# Patient Record
Sex: Male | Born: 1955 | Race: White | Hispanic: No | Marital: Single | Smoking: Current every day smoker
Health system: Southern US, Community
[De-identification: ages and names within clinical notes are randomized; demographics above are authoritative.]

## PROBLEM LIST (undated history)

## (undated) DIAGNOSIS — B1920 Unspecified viral hepatitis C without hepatic coma: Secondary | ICD-10-CM

## (undated) DIAGNOSIS — C4490 Unspecified malignant neoplasm of skin, unspecified: Secondary | ICD-10-CM

## (undated) DIAGNOSIS — K7460 Unspecified cirrhosis of liver: Secondary | ICD-10-CM

## (undated) DIAGNOSIS — Z8619 Personal history of other infectious and parasitic diseases: Secondary | ICD-10-CM

## (undated) DIAGNOSIS — M1990 Unspecified osteoarthritis, unspecified site: Secondary | ICD-10-CM

## (undated) HISTORY — PX: FRACTURE SURGERY: SHX138

## (undated) HISTORY — PX: APPENDECTOMY: SHX54

---

## 2014-08-22 ENCOUNTER — Other Ambulatory Visit: Payer: Self-pay | Admitting: Gastroenterology

## 2014-08-22 DIAGNOSIS — R748 Abnormal levels of other serum enzymes: Secondary | ICD-10-CM

## 2014-08-23 ENCOUNTER — Other Ambulatory Visit (HOSPITAL_COMMUNITY): Payer: Self-pay | Admitting: Gastroenterology

## 2014-08-23 ENCOUNTER — Other Ambulatory Visit: Payer: Self-pay | Admitting: Gastroenterology

## 2014-08-23 DIAGNOSIS — R748 Abnormal levels of other serum enzymes: Secondary | ICD-10-CM

## 2014-08-26 ENCOUNTER — Ambulatory Visit
Admission: RE | Admit: 2014-08-26 | Discharge: 2014-08-26 | Disposition: A | Discharge status: Home / Self Care | Payer: BC Managed Care – PPO | Source: Ambulatory Visit | Attending: Gastroenterology | Admitting: Gastroenterology

## 2014-08-26 ENCOUNTER — Ambulatory Visit: Admission: RE | Admit: 2014-08-26 | Payer: BC Managed Care – PPO | Source: Ambulatory Visit

## 2014-08-26 DIAGNOSIS — K7460 Unspecified cirrhosis of liver: Secondary | ICD-10-CM | POA: Insufficient documentation

## 2014-08-26 DIAGNOSIS — K766 Portal hypertension: Secondary | ICD-10-CM | POA: Insufficient documentation

## 2014-08-26 DIAGNOSIS — R748 Abnormal levels of other serum enzymes: Secondary | ICD-10-CM | POA: Insufficient documentation

## 2014-08-30 ENCOUNTER — Ambulatory Visit: Payer: Self-pay

## 2014-09-19 ENCOUNTER — Encounter: Payer: Self-pay | Admitting: *Deleted

## 2014-09-20 ENCOUNTER — Ambulatory Visit: Payer: BC Managed Care – PPO | Admitting: Anesthesiology

## 2014-09-20 ENCOUNTER — Encounter
Admission: RE | Disposition: A | Discharge status: Home / Self Care | Payer: Self-pay | Source: Ambulatory Visit | Attending: Gastroenterology

## 2014-09-20 ENCOUNTER — Encounter: Payer: Self-pay | Admitting: *Deleted

## 2014-09-20 ENCOUNTER — Ambulatory Visit
Admission: RE | Admit: 2014-09-20 | Discharge: 2014-09-20 | Disposition: A | Discharge status: Home / Self Care | Payer: BC Managed Care – PPO | Source: Ambulatory Visit | Attending: Gastroenterology | Admitting: Gastroenterology

## 2014-09-20 DIAGNOSIS — K766 Portal hypertension: Secondary | ICD-10-CM | POA: Insufficient documentation

## 2014-09-20 DIAGNOSIS — B1920 Unspecified viral hepatitis C without hepatic coma: Secondary | ICD-10-CM | POA: Diagnosis not present

## 2014-09-20 DIAGNOSIS — I8510 Secondary esophageal varices without bleeding: Secondary | ICD-10-CM | POA: Diagnosis not present

## 2014-09-20 DIAGNOSIS — Z85828 Personal history of other malignant neoplasm of skin: Secondary | ICD-10-CM | POA: Insufficient documentation

## 2014-09-20 DIAGNOSIS — Z79899 Other long term (current) drug therapy: Secondary | ICD-10-CM | POA: Diagnosis not present

## 2014-09-20 DIAGNOSIS — F17210 Nicotine dependence, cigarettes, uncomplicated: Secondary | ICD-10-CM | POA: Insufficient documentation

## 2014-09-20 DIAGNOSIS — Z1389 Encounter for screening for other disorder: Secondary | ICD-10-CM | POA: Diagnosis present

## 2014-09-20 DIAGNOSIS — K3189 Other diseases of stomach and duodenum: Secondary | ICD-10-CM | POA: Insufficient documentation

## 2014-09-20 DIAGNOSIS — K7460 Unspecified cirrhosis of liver: Secondary | ICD-10-CM | POA: Insufficient documentation

## 2014-09-20 DIAGNOSIS — M1990 Unspecified osteoarthritis, unspecified site: Secondary | ICD-10-CM | POA: Insufficient documentation

## 2014-09-20 HISTORY — PX: ESOPHAGOGASTRODUODENOSCOPY: SHX5428

## 2014-09-20 HISTORY — DX: Personal history of other infectious and parasitic diseases: Z86.19

## 2014-09-20 HISTORY — DX: Unspecified viral hepatitis C without hepatic coma: B19.20

## 2014-09-20 HISTORY — DX: Unspecified malignant neoplasm of skin, unspecified: C44.90

## 2014-09-20 HISTORY — DX: Unspecified osteoarthritis, unspecified site: M19.90

## 2014-09-20 HISTORY — DX: Unspecified cirrhosis of liver: K74.60

## 2014-09-20 SURGERY — ESOPHAGOGASTRODUODENOSCOPY (EGD) WITH PROPOFOL
Anesthesia: General

## 2014-09-20 SURGERY — EGD (ESOPHAGOGASTRODUODENOSCOPY)
Anesthesia: General

## 2014-09-20 MED ORDER — SODIUM CHLORIDE 0.9 % IV SOLN: INTRAVENOUS | Status: DC

## 2014-09-20 MED ORDER — EPHEDRINE SULFATE 50 MG/ML IJ SOLN
INTRAMUSCULAR | Status: DC | PRN
  Administered 2014-09-20: 20 mg via INTRAVENOUS

## 2014-09-20 MED ORDER — SODIUM CHLORIDE 0.9 % IV SOLN
INTRAVENOUS | Status: DC
  Administered 2014-09-20 (×2): via INTRAVENOUS

## 2014-09-20 MED ORDER — ONDANSETRON HCL 4 MG/2ML IJ SOLN: 4.0000 mg | Freq: Once | INTRAMUSCULAR | Status: DC | PRN

## 2014-09-20 MED ORDER — MIDAZOLAM HCL 5 MG/5ML IJ SOLN
INTRAMUSCULAR | Status: DC | PRN
  Administered 2014-09-20: 1 mg via INTRAVENOUS

## 2014-09-20 MED ORDER — PROPOFOL 10 MG/ML IV BOLUS
INTRAVENOUS | Status: DC | PRN
Start: 2014-09-20 — End: 2014-09-20
  Administered 2014-09-20: 45 mg via INTRAVENOUS

## 2014-09-20 MED ORDER — FENTANYL CITRATE (PF) 100 MCG/2ML IJ SOLN
INTRAMUSCULAR | Status: DC | PRN
  Administered 2014-09-20: 50 ug via INTRAVENOUS

## 2014-09-20 MED ORDER — FENTANYL CITRATE (PF) 100 MCG/2ML IJ SOLN: 25.0000 ug | INTRAMUSCULAR | Status: DC | PRN

## 2014-09-20 NOTE — Transfer of Care (Signed)
Immediate Anesthesia Transfer of Care Note  Patient: Lawrence Reilly  Procedure(s) Performed: Procedure(s): ESOPHAGOGASTRODUODENOSCOPY (EGD) (N/A)  Patient Location: PACU  Anesthesia Type:General  Level of Consciousness: awake  Airway & Oxygen Therapy: Patient Spontanous Breathing and Patient connected to nasal cannula oxygen  Post-op Assessment: Report given to RN and Post -op Vital signs reviewed and stable  Post vital signs: Reviewed and stable  Last Vitals:  Filed Vitals:   09/20/14 0737  BP: 126/66  Pulse: 89  Temp: 35.9 C  Resp: 17    Complications: No apparent anesthesia complications

## 2014-09-20 NOTE — H&P (Signed)
  Primary Care Physician:  Kirk Ruths., MD  Pre-Procedure History & Physical: HPI:  Lawrence Reilly is a 59 y.o. male is here for an endoscopy.   Past Medical History  Diagnosis Date  . Hepatitis C   . Arthritis   . History of chickenpox   . Skin cancer   . Cirrhosis of liver     Past Surgical History  Procedure Laterality Date  . Appendectomy    . Fracture surgery      Prior to Admission medications   Medication Sig Start Date End Date Taking? Authorizing Provider  ondansetron (ZOFRAN-ODT) 4 MG disintegrating tablet Take 4 mg by mouth every 8 (eight) hours as needed for nausea or vomiting.   Yes Historical Provider, MD  promethazine (PHENERGAN) 12.5 MG suppository Place 12.5 mg rectally every 6 (six) hours as needed for nausea or vomiting.   Yes Historical Provider, MD    Allergies as of 09/17/2014  . (Not on File)    History reviewed. No pertinent family history.  History   Social History  . Marital Status: Divorced    Spouse Name: N/A  . Number of Children: N/A  . Years of Education: N/A   Occupational History  . Not on file.   Social History Main Topics  . Smoking status: Current Every Day Smoker  . Smokeless tobacco: Not on file  . Alcohol Use: 0.6 oz/week    1 Cans of beer per week  . Drug Use: Not on file  . Sexual Activity: Not on file   Other Topics Concern  . Not on file   Social History Narrative     Physical Exam: BP 126/66 mmHg  Pulse 89  Temp(Src) 96.7 F (35.9 C) (Tympanic)  Resp 17  Ht 5\' 9"  (1.753 m)  Wt 165 lb (74.844 kg)  BMI 24.36 kg/m2  SpO2 98% General:   Alert,  pleasant and cooperative in NAD Head:  Normocephalic and atraumatic. Neck:  Supple; no masses or thyromegaly. Lungs:  Clear throughout to auscultation.    Heart:  Regular rate and rhythm. Abdomen:  Soft, nontender and nondistended. Normal bowel sounds, without guarding, and without rebound.   Neurologic:  Alert and  oriented x4;  grossly normal  neurologically.  Impression/Plan: Lawrence Reilly is here for an endoscopy to be performed for portal HTN, variceal screening  Risks, benefits, limitations, and alternatives regarding  endoscopy have been reviewed with the patient.  Questions have been answered.  All parties agreeable.   Josefine Class, MD  09/20/2014, 8:10 AM

## 2014-09-20 NOTE — Discharge Instructions (Signed)

## 2014-09-20 NOTE — Op Note (Signed)
[facility] Gastroenterology Patient Name: Lawrence Reilly Procedure Date: 09/20/2014 7:50 AM MRN: [id number] Account #: [id number] Date of Birth: 1955/07/25 Admit Type: Outpatient Age: 59 Room: [facility] ENDO ROOM 3 Gender: Male Note Status: Finalized Procedure:         Upper GI endoscopy Indications:       Cirrhosis rule out esophageal varices, Portal hypertension                     rule out esophageal varices Patient Profile:   This is a 59 year old male. Providers:         Gerrit Heck. Rayann Heman, MD Referring MD:      Ocie Cornfield. Ouida Sills, MD (Referring MD) Medicines:         Propofol per Anesthesia Complications:     No immediate complications. Procedure:         Pre-Anesthesia Assessment:                    - Prior to the procedure, a History and Physical was                     performed, and patient medications, allergies and                     sensitivities were reviewed. The patient's tolerance of                     previous anesthesia was reviewed.                    After obtaining informed consent, the endoscope was passed                     under direct vision. Throughout the procedure, the                     patient's blood pressure, pulse, and oxygen saturations                     were monitored continuously. The Endoscope was introduced                     through the mouth, and advanced to the second part of                     duodenum. The upper GI endoscopy was accomplished without                     difficulty. The patient tolerated the procedure well. Findings:      Grade I varices were found in the lower third of the esophagus.      Moderate portal hypertensive gastropathy was found in the entire       examined stomach.      Patchy moderately erythematous mucosa was found in the duodenal bulb. Impression:        - Grade I esophageal varices.                    - Portal hypertensive gastropathy.                    - Erythematous duodenopathy.               - No specimens collected. Recommendation:    - Observe patient in GI recovery unit.                    -  Resume regular diet.                    - Continue present medications.                    - Repeat the upper endoscopy in 2 years for screening                     purposes.                    - Return to GI clinic.                    - Will not start b-blocker because varices are small, have                     not bled and clinical benefit of B-blocker in this setting                     is not established.                    - The findings and recommendations were discussed with the                     patient.                    - The findings and recommendations were discussed with the                     patient's family. Procedure Code(s): --- Professional ---                    [phone], Esophagogastroduodenoscopy, flexible, transoral;                     diagnostic, including collection of specimen(s) by                     brushing or washing, when performed (separate procedure) CPT copyright 2014 American Medical Association. All rights reserved. The codes documented in this report are preliminary and upon coder review may  be revised to meet current compliance requirements. Mellody Life, MD 09/20/2014 8:31:16 AM This report has been signed electronically. Number of Addenda: 0 Note Initiated On: 09/20/2014 7:50 AM      [facility]

## 2014-09-20 NOTE — Anesthesia Postprocedure Evaluation (Signed)
  Anesthesia Post-op Note  Patient: Lawrence Reilly  Procedure(s) Performed: Procedure(s): ESOPHAGOGASTRODUODENOSCOPY (EGD) (N/A)  Anesthesia type:General  Patient location: PACU  Post pain: Pain level controlled  Post assessment: Post-op Vital signs reviewed, Patient's Cardiovascular Status Stable, Respiratory Function Stable, Patent Airway and No signs of Nausea or vomiting  Post vital signs: Reviewed and stable  Last Vitals:  Filed Vitals:   09/20/14 0910  BP: 147/81  Pulse: 103  Temp:   Resp: 26    Level of consciousness: awake, alert  and patient cooperative  Complications: No apparent anesthesia complications

## 2014-09-20 NOTE — Anesthesia Procedure Notes (Signed)
Date/Time: 09/20/2014 8:28 AM Performed by: Allean Found Pre-anesthesia Checklist: Patient identified, Emergency Drugs available, Suction available, Patient being monitored and Timeout performed Patient Re-evaluated:Patient Re-evaluated prior to inductionOxygen Delivery Method: Nasal cannula Placement Confirmation: positive ETCO2 Dental Injury: Teeth and Oropharynx as per pre-operative assessment

## 2014-09-20 NOTE — Anesthesia Preprocedure Evaluation (Addendum)
Anesthesia Evaluation  Patient identified by MRN, date of birth, ID band Patient awake    Reviewed: Allergy & Precautions, NPO status , Patient's Chart, lab work & pertinent test results  Airway Mallampati: II  TM Distance: >3 FB Neck ROM: Full    Dental  (+) Chipped   Pulmonary Current Smoker,  breath sounds clear to auscultation  Pulmonary exam normal       Cardiovascular Normal cardiovascular exam    Neuro/Psych negative neurological ROS  negative psych ROS   GI/Hepatic (+) Cirrhosis -      , Hepatitis -, C  Endo/Other  negative endocrine ROS  Renal/GU negative Renal ROS     Musculoskeletal  (+) Arthritis -, Osteoarthritis,    Abdominal Normal abdominal exam  (+)   Peds  Hematology negative hematology ROS (+)   Anesthesia Other Findings   Reproductive/Obstetrics                            Anesthesia Physical Anesthesia Plan  ASA: III  Anesthesia Plan: General   Post-op Pain Management:    Induction: Intravenous  Airway Management Planned: Nasal Cannula  Additional Equipment:   Intra-op Plan:   Post-operative Plan:   Informed Consent: I have reviewed the patients History and Physical, chart, labs and discussed the procedure including the risks, benefits and alternatives for the proposed anesthesia with the patient or authorized representative who has indicated his/her understanding and acceptance.   Dental advisory given  Plan Discussed with: CRNA and Surgeon  Anesthesia Plan Comments:         Anesthesia Quick Evaluation

## 2014-09-21 NOTE — Progress Notes (Signed)
Non-Identifying Voicemail.  No Message left. 

## 2014-09-24 ENCOUNTER — Encounter: Payer: Self-pay | Admitting: Gastroenterology

## 2015-05-05 ENCOUNTER — Other Ambulatory Visit: Payer: Self-pay | Admitting: Student

## 2015-05-05 DIAGNOSIS — K7460 Unspecified cirrhosis of liver: Secondary | ICD-10-CM

## 2015-05-05 DIAGNOSIS — B182 Chronic viral hepatitis C: Secondary | ICD-10-CM

## 2015-05-08 ENCOUNTER — Ambulatory Visit
Admission: RE | Admit: 2015-05-08 | Discharge: 2015-05-08 | Disposition: A | Discharge status: Home / Self Care | Payer: BC Managed Care – PPO | Source: Ambulatory Visit | Attending: Student | Admitting: Student

## 2015-05-08 DIAGNOSIS — K7460 Unspecified cirrhosis of liver: Secondary | ICD-10-CM | POA: Diagnosis present

## 2015-05-08 DIAGNOSIS — B182 Chronic viral hepatitis C: Secondary | ICD-10-CM | POA: Diagnosis present

## 2015-10-21 ENCOUNTER — Other Ambulatory Visit: Payer: Self-pay | Admitting: Student

## 2015-10-21 DIAGNOSIS — B182 Chronic viral hepatitis C: Secondary | ICD-10-CM

## 2015-10-27 ENCOUNTER — Ambulatory Visit
Admission: RE | Admit: 2015-10-27 | Discharge: 2015-10-27 | Disposition: A | Discharge status: Home / Self Care | Payer: BC Managed Care – PPO | Source: Ambulatory Visit | Attending: Student | Admitting: Student

## 2015-10-27 DIAGNOSIS — B182 Chronic viral hepatitis C: Secondary | ICD-10-CM | POA: Diagnosis present

## 2016-01-22 IMAGING — US US ABDOMEN LIMITED
1 series · 13 of 13 positions shown · non-contrast
Comparison: None.

CLINICAL DATA: Elevated liver enzymes

EXAM:
US ABDOMEN LIMITED - RIGHT UPPER QUADRANT
ULTRASOUND HEPATIC ELASTOGRAPHY
TECHNIQUE: Limited right upper quadrant abdominal ultrasound was performed. In
addition, ultrasound elastography evaluation of the liver was
performed. A region of interest was placed in the right lobe of the
liver. Following application of a compressive sonographic pulse,
shear waves were detected in the adjacent hepatic tissue and the
shear wave velocity was calculated. Multiple assessments were
performed at the selected site. Median shear wave velocity is
correlated to a Metavir fibrosis score.

[Series 1: us abdomen limited · 0.21mm/px · 13 of 13 slices shown]
[im 1/13]
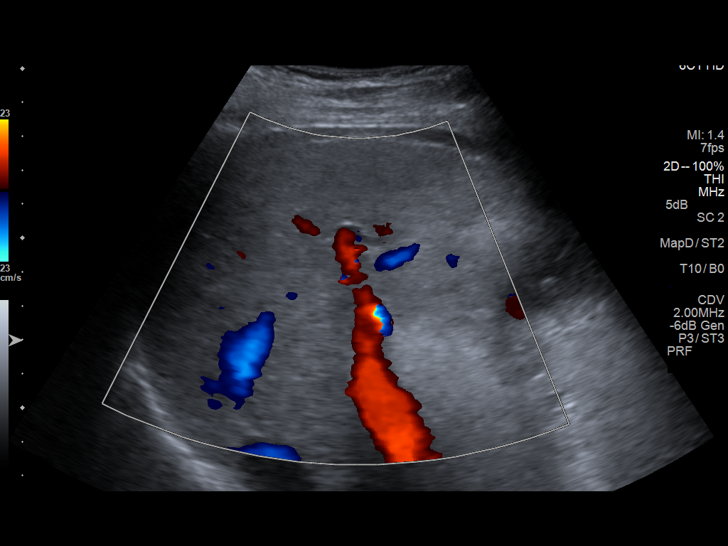
[im 2/13]
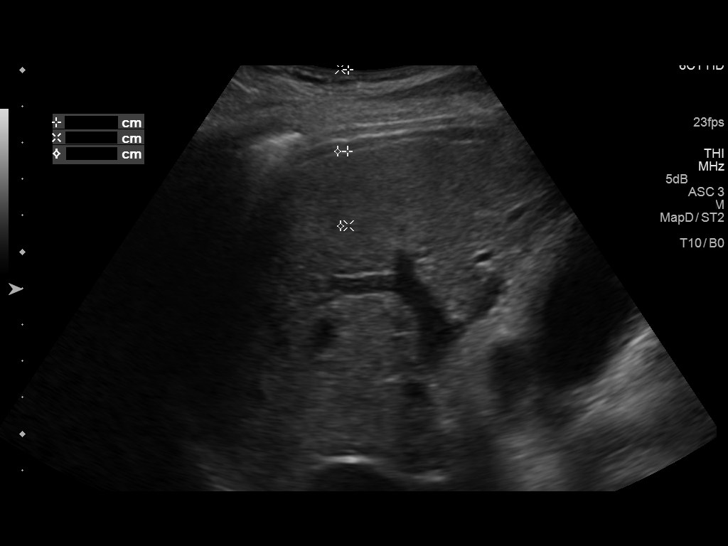
[im 3/13]
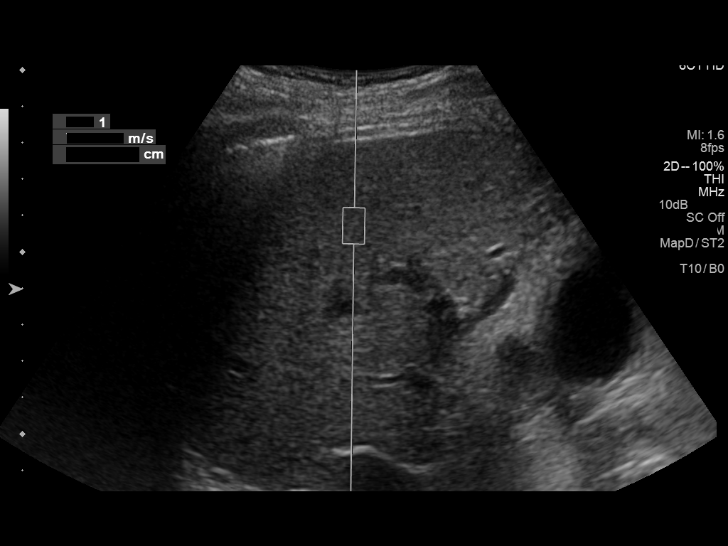
[im 4/13]
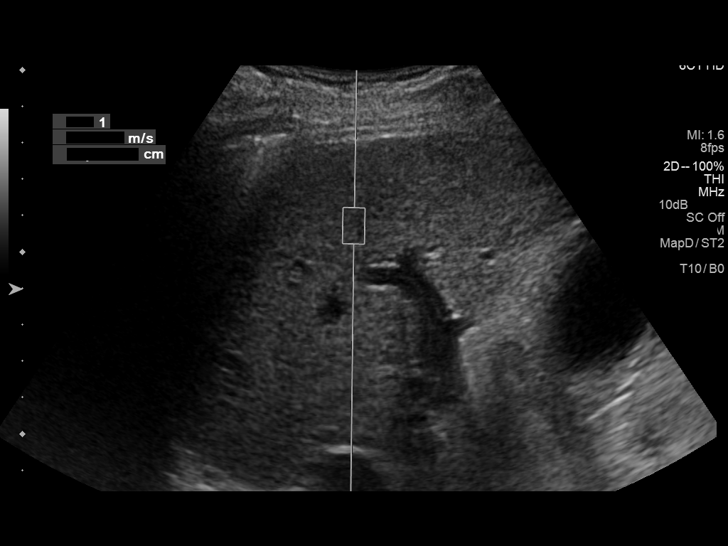
[im 5/13]
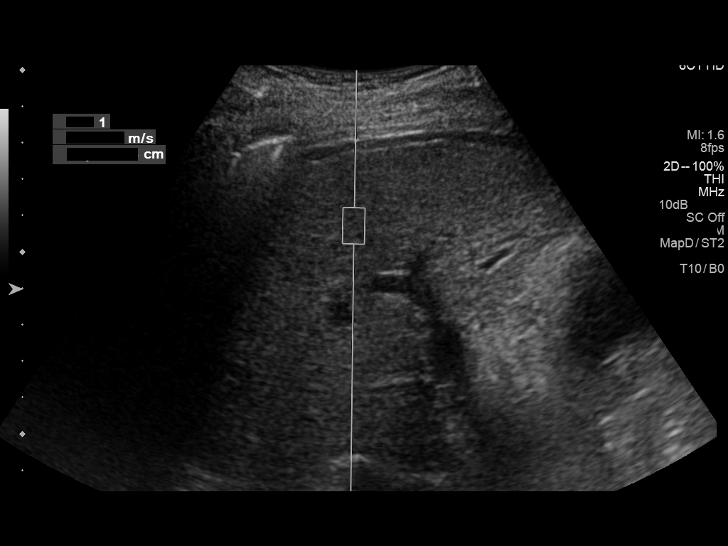
[im 6/13]
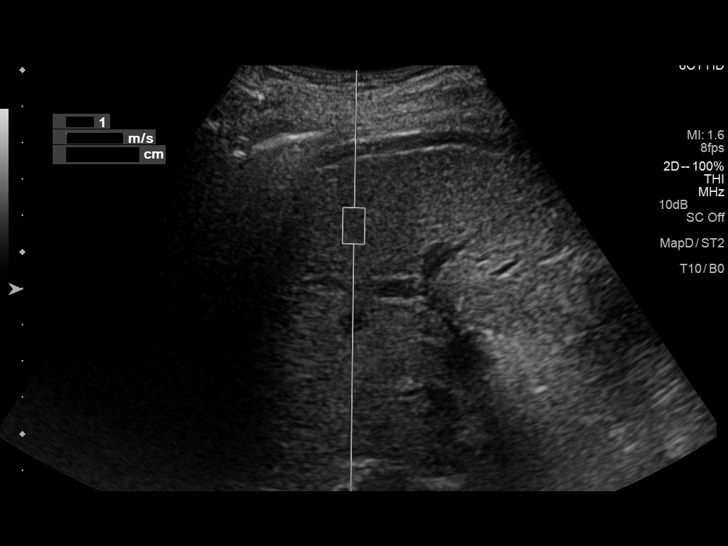
[im 7/13]
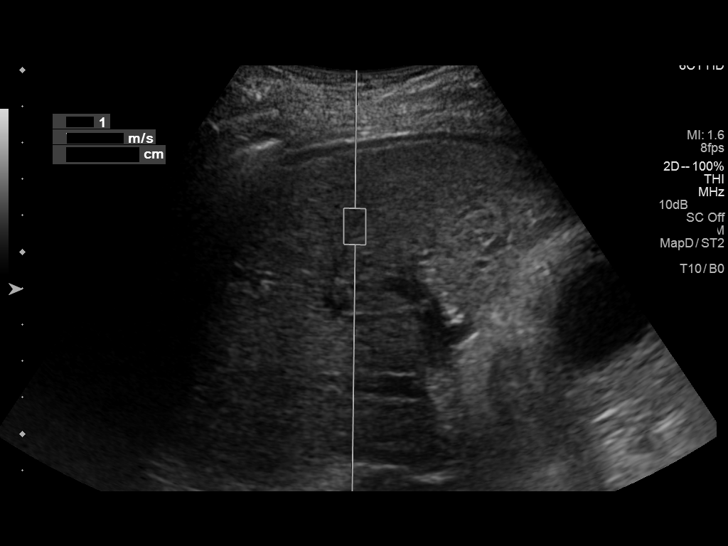
[im 8/13]
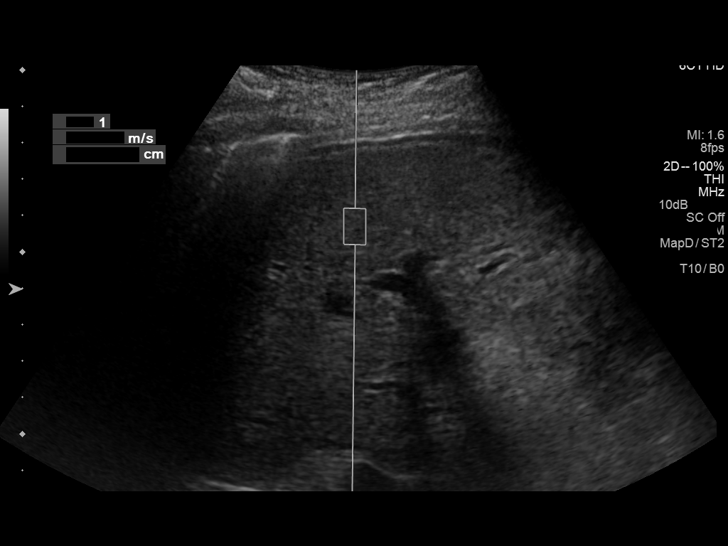
[im 9/13]
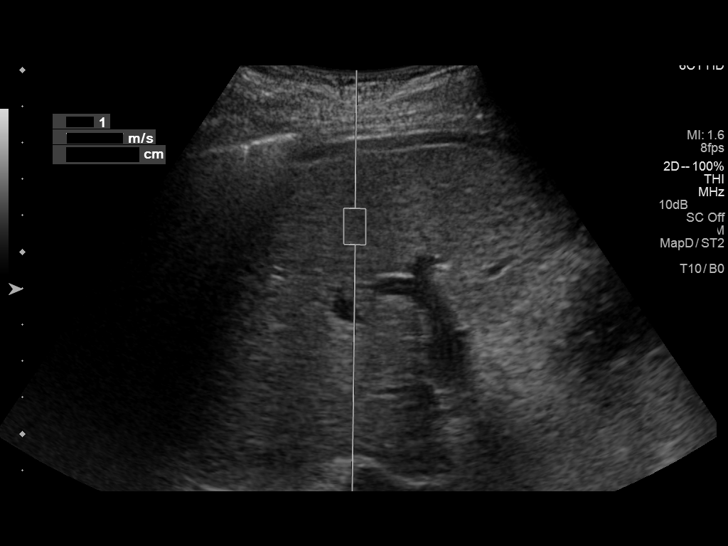
[im 10/13]
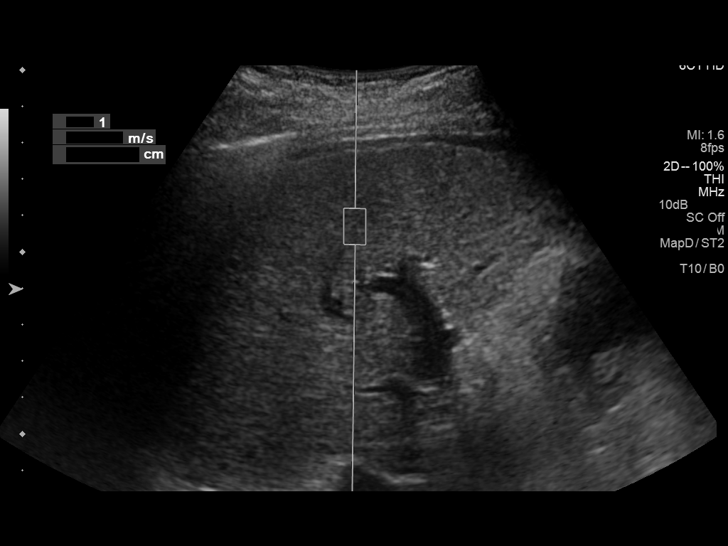
[im 11/13]
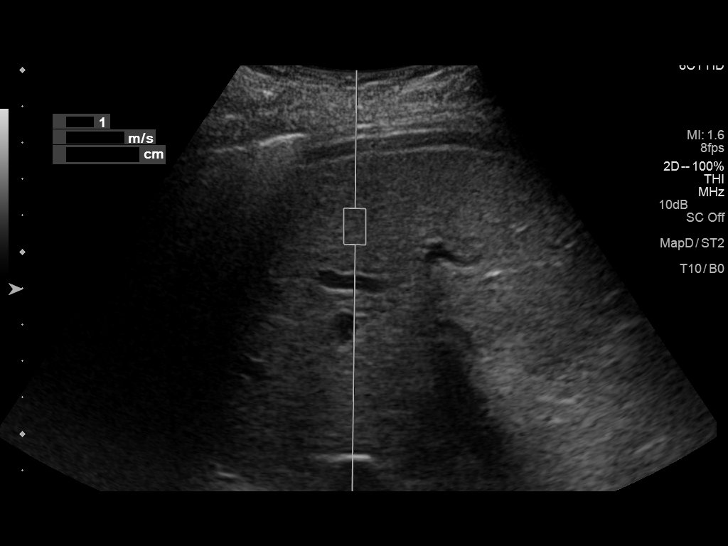
[im 12/13]
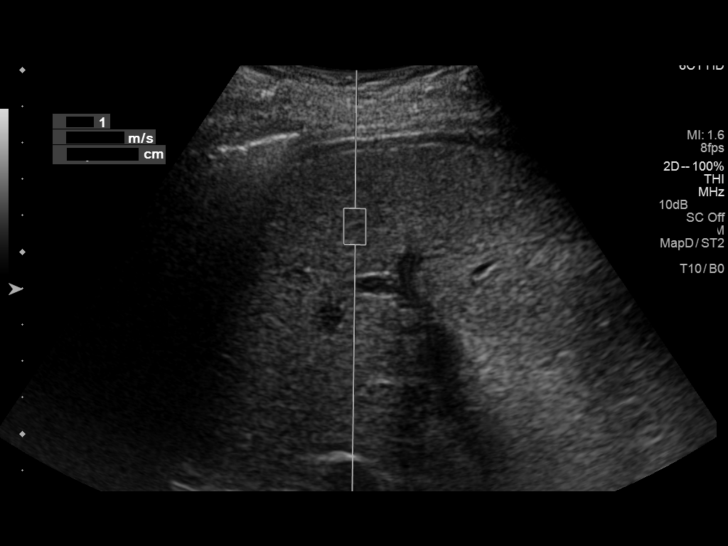
[im 13/13]
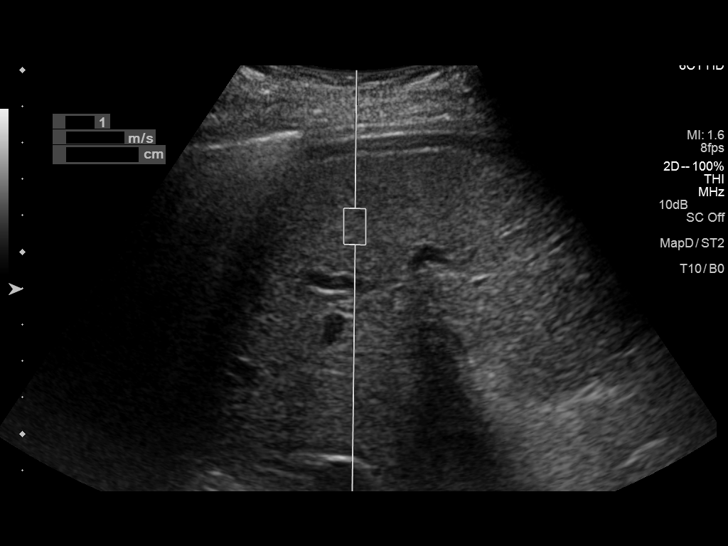

[13 of 13 positions shown; findings below may reference images not displayed]

FINDINGS: ULTRASOUND ABDOMEN LIMITED RIGHT UPPER QUADRANT

Gallbladder: The gallbladder wall appears thickened measuring
mm. Pericholecystic fluid noted. Nonmobile echogenic foci are
identified along the gallbladder wall.

Common bile duct: Diameter: 3.9 mm.

Liver: The liver has a nodular contour or compatible with cirrhosis.
There is mild perihepatic ascites noted.

ULTRASOUND HEPATIC ELASTOGRAPHY

Device: Siemens Helix VQ

Transducer: 6 C1

Patient position: Supine

Number of measurements:  10

Hepatic Segment:  8

Median velocity:   2.4  m/sec

IQR:

IQR/Median velocity ratio:

Corresponding Metavir fibrosis score:  F 3/F 4

Risk of fibrosis: High

Limitations of exam: None

Pertinent findings noted on other imaging exams: Cirrhotic appearing
liver.

Please note that abnormal shear wave velocities may also be
identified in clinical settings other than with hepatic fibrosis,
such as: acute hepatitis, elevated right heart and central venous
pressures including use of beta blockers, Kiss[place] disease
(Cartel), infiltrative processes such as
mastocytosis/amyloidosis/infiltrative tumor, extrahepatic
cholestasis, in the post-prandial state, and liver transplantation.
Correlation with patient history, laboratory data, and clinical
condition recommended.
IMPRESSION: 1. Nodular appearing liver surface compatible with cirrhosis. There
is mild peripancreatic piece and pericholecystic fluid.
2. Gallbladder wall thickening which is nonspecific in the setting
of cirrhosis and portal venous hypertension.

Median hepatic shear wave velocity is calculated at 2.4 m/sec.
Corresponding Metavir fibrosis score is F 3/F 4.
Risk of fibrosis is high.

Follow-up:  Advised

## 2016-01-29 ENCOUNTER — Encounter: Payer: Self-pay | Admitting: *Deleted

## 2016-01-30 ENCOUNTER — Ambulatory Visit
Admission: RE | Admit: 2016-01-30 | Discharge: 2016-01-30 | Disposition: A | Discharge status: Home / Self Care | Payer: BC Managed Care – PPO | Source: Ambulatory Visit | Attending: Unknown Physician Specialty | Admitting: Unknown Physician Specialty

## 2016-01-30 ENCOUNTER — Ambulatory Visit: Payer: BC Managed Care – PPO | Admitting: Anesthesiology

## 2016-01-30 ENCOUNTER — Encounter
Admission: RE | Disposition: A | Discharge status: Home / Self Care | Payer: Self-pay | Source: Ambulatory Visit | Attending: Unknown Physician Specialty

## 2016-01-30 DIAGNOSIS — D128 Benign neoplasm of rectum: Secondary | ICD-10-CM | POA: Insufficient documentation

## 2016-01-30 DIAGNOSIS — D125 Benign neoplasm of sigmoid colon: Secondary | ICD-10-CM | POA: Diagnosis not present

## 2016-01-30 DIAGNOSIS — F17200 Nicotine dependence, unspecified, uncomplicated: Secondary | ICD-10-CM | POA: Insufficient documentation

## 2016-01-30 DIAGNOSIS — J449 Chronic obstructive pulmonary disease, unspecified: Secondary | ICD-10-CM | POA: Insufficient documentation

## 2016-01-30 DIAGNOSIS — K633 Ulcer of intestine: Secondary | ICD-10-CM | POA: Diagnosis not present

## 2016-01-30 DIAGNOSIS — R195 Other fecal abnormalities: Secondary | ICD-10-CM | POA: Insufficient documentation

## 2016-01-30 DIAGNOSIS — Z85828 Personal history of other malignant neoplasm of skin: Secondary | ICD-10-CM | POA: Diagnosis not present

## 2016-01-30 HISTORY — PX: COLONOSCOPY WITH PROPOFOL: SHX5780

## 2016-01-30 SURGERY — COLONOSCOPY WITH PROPOFOL
Anesthesia: General

## 2016-01-30 MED ORDER — LIDOCAINE 2% (20 MG/ML) 5 ML SYRINGE
INTRAMUSCULAR | Status: DC | PRN
  Administered 2016-01-30: 40 mg via INTRAVENOUS

## 2016-01-30 MED ORDER — SODIUM CHLORIDE 0.9 % IV SOLN: INTRAVENOUS | Status: DC

## 2016-01-30 MED ORDER — PROPOFOL 10 MG/ML IV BOLUS
INTRAVENOUS | Status: DC | PRN
  Administered 2016-01-30: 100 mg via INTRAVENOUS

## 2016-01-30 MED ORDER — PROPOFOL 500 MG/50ML IV EMUL
INTRAVENOUS | Status: DC | PRN
  Administered 2016-01-30: 180 ug/kg/min via INTRAVENOUS

## 2016-01-30 MED ORDER — EPHEDRINE SULFATE 50 MG/ML IJ SOLN
INTRAMUSCULAR | Status: DC | PRN
  Administered 2016-01-30: 5 mg via INTRAVENOUS

## 2016-01-30 MED ORDER — MIDAZOLAM HCL 5 MG/5ML IJ SOLN
INTRAMUSCULAR | Status: DC | PRN
  Administered 2016-01-30: 1 mg via INTRAVENOUS

## 2016-01-30 MED ORDER — PHENYLEPHRINE HCL 10 MG/ML IJ SOLN
INTRAMUSCULAR | Status: DC | PRN
  Administered 2016-01-30 (×2): 100 ug via INTRAVENOUS

## 2016-01-30 MED ORDER — FENTANYL CITRATE (PF) 100 MCG/2ML IJ SOLN
INTRAMUSCULAR | Status: DC | PRN
  Administered 2016-01-30: 50 ug via INTRAVENOUS

## 2016-01-30 MED ORDER — SODIUM CHLORIDE 0.9 % IV SOLN
INTRAVENOUS | Status: DC
  Administered 2016-01-30: 1000 mL via INTRAVENOUS
  Administered 2016-01-30: 15:00:00 via INTRAVENOUS

## 2016-01-30 NOTE — Transfer of Care (Signed)
Immediate Anesthesia Transfer of Care Note  Patient: Lawrence Reilly  Procedure(s) Performed: Procedure(s): COLONOSCOPY WITH PROPOFOL (N/A)  Patient Location: PACU and Endoscopy Unit  Anesthesia Type:General  Level of Consciousness: awake and patient cooperative  Airway & Oxygen Therapy: Patient Spontanous Breathing and Patient connected to nasal cannula oxygen  Post-op Assessment: Report given to RN and Post -op Vital signs reviewed and stable  Post vital signs: Reviewed and stable  Last Vitals:  Vitals:   01/30/16 1415  BP: 129/68  Resp: 20  Temp: 36.3 C    Last Pain: There were no vitals filed for this visit.       Complications: No apparent anesthesia complications

## 2016-01-30 NOTE — Op Note (Signed)
[facility] Gastroenterology Patient Name: Lawrence Reilly Procedure Date: 01/30/2016 3:23 PM MRN: [id number] Account #: [id number] Date of Birth: 1955-10-04 Admit Type: Outpatient Age: 60 Room: [facility] ENDO ROOM 1 Gender: Male Note Status: Finalized Procedure:            Colonoscopy Indications:          Positive Cologuard test Providers:            Manya Silvas, MD Referring MD:         Ocie Cornfield. Ouida Sills MD, MD (Referring MD) Medicines:            Propofol per Anesthesia Complications:        No immediate complications. Procedure:            Pre-Anesthesia Assessment:                       - After reviewing the risks and benefits, the patient                        was deemed in satisfactory condition to undergo the                        procedure.                       After obtaining informed consent, the colonoscope was                        passed under direct vision. Throughout the procedure,                        the patient's blood pressure, pulse, and oxygen                        saturations were monitored continuously. The                        Colonoscope was introduced through the anus and                        advanced to the the cecum, identified by appendiceal                        orifice and ileocecal valve. The colonoscopy was                        performed without difficulty. The patient tolerated the                        procedure well. The quality of the bowel preparation                        was excellent. Findings:      A small polyp was found in the sigmoid colon. The polyp was sessile. The       polyp was removed with a hot snare. Resection and retrieval were       complete. To prevent bleeding after the polypectomy, one hemostatic clip       was successfully placed. There was no bleeding during, or at the end, of       the procedure.  A diminutive polyp was found in the rectum. The polyp was sessile. The       polyp  was removed with a hot snare. Resection and retrieval were       complete.      A single (solitary) four mm ulcer was found in the proximal ascending       colon. No bleeding was present. No stigmata of recent bleeding were       seen. Biopsies were taken with a cold forceps for histology.      Multiple small- large patchy angioectasias without bleeding were found       in the transverse colon, in the ascending colon and in the cecum. Impression:           - One small polyp in the sigmoid colon, removed with a                        hot snare. Resected and retrieved. Clip was placed.                       - One diminutive polyp in the rectum, removed with a                        hot snare. Resected and retrieved.                       - A single (solitary) ulcer in the proximal ascending                        colon. Biopsied.                       - Multiple non-bleeding colonic angioectasias. Recommendation:       - Await pathology results. Manya Silvas, MD 01/30/2016 3:55:22 PM This report has been signed electronically. Number of Addenda: 0 Note Initiated On: 01/30/2016 3:23 PM Scope Withdrawal Time: 0 hours 14 minutes 12 seconds  Total Procedure Duration: 0 hours 20 minutes 20 seconds       [facility]

## 2016-01-30 NOTE — H&P (Signed)
   Primary Care Physician:  Kirk Ruths., MD Primary Gastroenterologist:  Dr. Vira Agar  Pre-Procedure History & Physical: HPI:  Lawrence Reilly is a 60 y.o. male is here for an colonoscopy.   Past Medical History:  Diagnosis Date  . Arthritis   . Cirrhosis of liver ([place])   . Hepatitis C   . History of chickenpox   . Skin cancer     Past Surgical History:  Procedure Laterality Date  . APPENDECTOMY    . ESOPHAGOGASTRODUODENOSCOPY N/A 09/20/2014   Procedure: ESOPHAGOGASTRODUODENOSCOPY (EGD);  Surgeon: Josefine Class, MD;  Location: [facility] ENDOSCOPY;  Service: Endoscopy;  Laterality: N/A;  . FRACTURE SURGERY      Prior to Admission medications   Medication Sig Start Date End Date Taking? Authorizing Provider  Multiple Vitamin (MULTIVITAMIN) tablet Take 1 tablet by mouth daily.   Yes Historical Provider, MD  ondansetron (ZOFRAN-ODT) 4 MG disintegrating tablet Take 4 mg by mouth every 8 (eight) hours as needed for nausea or vomiting.   Yes Historical Provider, MD  promethazine (PHENERGAN) 12.5 MG suppository Place 12.5 mg rectally every 6 (six) hours as needed for nausea or vomiting.   Yes Historical Provider, MD    Allergies as of 01/27/2016  . (Not on File)    History reviewed. No pertinent family history.  Social History   Social History  . Marital status: Single    Spouse name: N/A  . Number of children: N/A  . Years of education: N/A   Occupational History  . Not on file.   Social History Main Topics  . Smoking status: Current Every Day Smoker  . Smokeless tobacco: Former Systems developer  . Alcohol use 0.6 oz/week    1 Cans of beer per week  . Drug use: Unknown  . Sexual activity: Not on file   Other Topics Concern  . Not on file   Social History Narrative  . No narrative on file    Review of Systems: See HPI, otherwise negative ROS  Physical Exam: BP 129/68   Temp 97.3 F (36.3 C)   Resp 20   Ht 5\' 9"  (1.753 m)   Wt 81.6 kg (180 lb)   SpO2 97%    BMI 26.58 kg/m  General:   Alert,  pleasant and cooperative in NAD Head:  Normocephalic and atraumatic. Neck:  Supple; no masses or thyromegaly. Lungs:  Clear throughout to auscultation.    Heart:  Regular rate and rhythm. Abdomen:  Soft, nontender and nondistended. Normal bowel sounds, without guarding, and without rebound.   Neurologic:  Alert and  oriented x4;  grossly normal neurologically.  Impression/Plan: Lawrence Reilly is here for an colonoscopy to be performed for positive cologuard test  Risks, benefits, limitations, and alternatives regarding  colonoscopy have been reviewed with the patient.  Questions have been answered.  All parties agreeable.   Gaylyn Cheers, MD  01/30/2016, 3:19 PM

## 2016-01-30 NOTE — Anesthesia Preprocedure Evaluation (Signed)
Anesthesia Evaluation  Patient identified by MRN, date of birth, ID band Patient awake    Reviewed: Allergy & Precautions, NPO status , Patient's Chart, lab work & pertinent test results  Airway Mallampati: II       Dental  (+) Edentulous Upper, Partial Lower   Pulmonary COPD, Current Smoker,     + decreased breath sounds      Cardiovascular Exercise Tolerance: Good  Rhythm:Regular Rate:Normal     Neuro/Psych    GI/Hepatic negative GI ROS, Neg liver ROS,   Endo/Other  negative endocrine ROS  Renal/GU negative Renal ROS     Musculoskeletal   Abdominal Normal abdominal exam  (+)   Peds negative pediatric ROS (+)  Hematology negative hematology ROS (+)   Anesthesia Other Findings   Reproductive/Obstetrics                             Anesthesia Physical Anesthesia Plan  ASA: III  Anesthesia Plan: General   Post-op Pain Management:    Induction: Intravenous  Airway Management Planned: Natural Airway and Nasal Cannula  Additional Equipment:   Intra-op Plan:   Post-operative Plan:   Informed Consent: I have reviewed the patients History and Physical, chart, labs and discussed the procedure including the risks, benefits and alternatives for the proposed anesthesia with the patient or authorized representative who has indicated his/her understanding and acceptance.     Plan Discussed with: CRNA  Anesthesia Plan Comments:         Anesthesia Quick Evaluation

## 2016-02-02 ENCOUNTER — Encounter: Payer: Self-pay | Admitting: Unknown Physician Specialty

## 2016-02-02 NOTE — Anesthesia Postprocedure Evaluation (Signed)
Anesthesia Post Note  Patient: Lawrence Reilly  Procedure(s) Performed: Procedure(s) (LRB): COLONOSCOPY WITH PROPOFOL (N/A)  Patient location during evaluation: Endoscopy Anesthesia Type: General Level of consciousness: awake and alert Pain management: pain level controlled Vital Signs Assessment: post-procedure vital signs reviewed and stable Respiratory status: spontaneous breathing and respiratory function stable Cardiovascular status: stable Anesthetic complications: no    Last Vitals:  Vitals:   01/30/16 1600 01/30/16 1601  BP:  116/63  Pulse:  96  Resp:  17  Temp: (!) 36 C (!) 36 C    Last Pain:  Vitals:   02/02/16 0746  TempSrc:   PainSc: 0-No pain                 Rainie Crenshaw K

## 2016-02-04 LAB — SURGICAL PATHOLOGY

## 2016-04-27 ENCOUNTER — Other Ambulatory Visit: Payer: Self-pay | Admitting: Student

## 2016-04-27 DIAGNOSIS — B182 Chronic viral hepatitis C: Secondary | ICD-10-CM

## 2016-04-30 ENCOUNTER — Ambulatory Visit
Admission: RE | Admit: 2016-04-30 | Discharge: 2016-04-30 | Disposition: A | Discharge status: Home / Self Care | Payer: BC Managed Care – PPO | Source: Ambulatory Visit | Attending: Student | Admitting: Student

## 2016-04-30 DIAGNOSIS — B182 Chronic viral hepatitis C: Secondary | ICD-10-CM | POA: Insufficient documentation

## 2017-06-27 ENCOUNTER — Telehealth: Payer: Self-pay | Admitting: *Deleted

## 2017-06-27 DIAGNOSIS — Z87891 Personal history of nicotine dependence: Secondary | ICD-10-CM

## 2017-06-27 DIAGNOSIS — Z122 Encounter for screening for malignant neoplasm of respiratory organs: Secondary | ICD-10-CM

## 2017-06-27 NOTE — Telephone Encounter (Signed)
Received referral for initial lung cancer screening scan. Contacted patient and obtained smoking history,(current, 100 pack year) as well as answering questions related to screening process. Patient denies signs of lung cancer such as weight loss or hemoptysis. Patient denies comorbidity that would prevent curative treatment if lung cancer were found. Patient is scheduled for shared decision making visit and CT scan on 07/12/17.

## 2017-07-12 ENCOUNTER — Encounter: Payer: Self-pay | Admitting: Oncology

## 2017-07-12 ENCOUNTER — Inpatient Hospital Stay: Payer: BC Managed Care – PPO | Attending: Oncology | Admitting: Oncology

## 2017-07-12 ENCOUNTER — Ambulatory Visit
Admission: RE | Admit: 2017-07-12 | Discharge: 2017-07-12 | Disposition: A | Discharge status: Home / Self Care | Payer: BC Managed Care – PPO | Source: Ambulatory Visit | Attending: Oncology | Admitting: Oncology

## 2017-07-12 DIAGNOSIS — Z87891 Personal history of nicotine dependence: Secondary | ICD-10-CM | POA: Insufficient documentation

## 2017-07-12 DIAGNOSIS — R948 Abnormal results of function studies of other organs and systems: Secondary | ICD-10-CM | POA: Diagnosis not present

## 2017-07-12 DIAGNOSIS — Z122 Encounter for screening for malignant neoplasm of respiratory organs: Secondary | ICD-10-CM | POA: Diagnosis not present

## 2017-07-12 DIAGNOSIS — F17210 Nicotine dependence, cigarettes, uncomplicated: Secondary | ICD-10-CM | POA: Diagnosis not present

## 2017-07-12 DIAGNOSIS — J432 Centrilobular emphysema: Secondary | ICD-10-CM | POA: Insufficient documentation

## 2017-07-12 DIAGNOSIS — I700 Atherosclerosis of aorta: Secondary | ICD-10-CM | POA: Diagnosis not present

## 2017-07-12 NOTE — Progress Notes (Signed)
In accordance with CMS guidelines, patient has met eligibility criteria including age, absence of signs or symptoms of lung cancer.  Social History   Tobacco Use  . Smoking status: Current Every Day Smoker  . Smokeless tobacco: Former Network engineer Use Topics  . Alcohol use: Yes    Alcohol/week: 0.6 oz    Types: 1 Cans of beer per week  . Drug use: Not on file     A shared decision-making session was conducted prior to the performance of CT scan. This includes one or more decision aids, includes benefits and harms of screening, follow-up diagnostic testing, over-diagnosis, false positive rate, and total radiation exposure.  Counseling on the importance of adherence to annual lung cancer LDCT screening, impact of co-morbidities, and ability or willingness to undergo diagnosis and treatment is imperative for compliance of the program.  Counseling on the importance of continued smoking cessation for former smokers; the importance of smoking cessation for current smokers, and information about tobacco cessation interventions have been given to patient including [place] and [employer] quit [place] programs.  Written order for lung cancer screening with LDCT has been given to the patient and any and all questions have been answered to the best of my abilities.   Yearly follow up will be coordinated by Burgess Estelle, Thoracic Navigator.  Faythe Casa, NP 07/12/2017 8:42 AM

## 2017-07-18 ENCOUNTER — Telehealth: Payer: Self-pay | Admitting: *Deleted

## 2017-07-18 NOTE — Telephone Encounter (Signed)
Notified patient of LDCT lung cancer screening program results with recommendation for 12 month follow up imaging. Also notified of incidental findings noted below and is encouraged to discuss further with PCP, especially the renal findings, who will receive a copy of this note and/or the CT report. Patient verbalizes understanding.   IMPRESSION: 1. Lung-RADS 2, benign appearance or behavior. Continue annual screening with low-dose chest CT without contrast in 12 months. 2. Aortic Atherosclerosis (ICD10-I70.0) and Emphysema (ICD10-J43.9). 3. 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

## 2017-07-20 ENCOUNTER — Other Ambulatory Visit: Payer: Self-pay | Admitting: Student

## 2017-07-20 DIAGNOSIS — B182 Chronic viral hepatitis C: Secondary | ICD-10-CM

## 2017-07-28 ENCOUNTER — Ambulatory Visit: Admission: RE | Admit: 2017-07-28 | Payer: BC Managed Care – PPO | Source: Ambulatory Visit

## 2017-08-05 ENCOUNTER — Ambulatory Visit
Admission: RE | Admit: 2017-08-05 | Discharge: 2017-08-05 | Disposition: A | Discharge status: Home / Self Care | Payer: BC Managed Care – PPO | Source: Ambulatory Visit | Attending: Student | Admitting: Student

## 2017-08-05 DIAGNOSIS — K7460 Unspecified cirrhosis of liver: Secondary | ICD-10-CM | POA: Insufficient documentation

## 2017-08-05 DIAGNOSIS — R932 Abnormal findings on diagnostic imaging of liver and biliary tract: Secondary | ICD-10-CM | POA: Insufficient documentation

## 2017-08-05 DIAGNOSIS — B182 Chronic viral hepatitis C: Secondary | ICD-10-CM

## 2017-08-05 DIAGNOSIS — I8500 Esophageal varices without bleeding: Secondary | ICD-10-CM | POA: Diagnosis not present

## 2017-08-05 MED ORDER — IOPAMIDOL (ISOVUE-300) INJECTION 61%
85.0000 mL | Freq: Once | INTRAVENOUS | Status: AC | PRN
  Administered 2017-08-05: 85 mL via INTRAVENOUS

## 2017-08-05 MED ORDER — IOPAMIDOL (ISOVUE-300) INJECTION 61%: 100.0000 mL | Freq: Once | INTRAVENOUS | Status: DC | PRN

## 2017-08-08 ENCOUNTER — Other Ambulatory Visit: Payer: Self-pay | Admitting: Student

## 2017-08-09 ENCOUNTER — Other Ambulatory Visit: Payer: Self-pay | Admitting: Student

## 2017-08-09 DIAGNOSIS — R932 Abnormal findings on diagnostic imaging of liver and biliary tract: Secondary | ICD-10-CM

## 2017-08-19 ENCOUNTER — Ambulatory Visit
Admission: RE | Admit: 2017-08-19 | Discharge: 2017-08-19 | Disposition: A | Discharge status: Home / Self Care | Payer: BC Managed Care – PPO | Source: Ambulatory Visit | Attending: Student | Admitting: Student

## 2017-08-19 DIAGNOSIS — R9389 Abnormal findings on diagnostic imaging of other specified body structures: Secondary | ICD-10-CM | POA: Diagnosis present

## 2017-08-19 DIAGNOSIS — R932 Abnormal findings on diagnostic imaging of liver and biliary tract: Secondary | ICD-10-CM

## 2017-08-19 MED ORDER — TECHNETIUM TC 99M MEBROFENIN IV KIT
5.0000 | PACK | Freq: Once | INTRAVENOUS | Status: AC | PRN
  Administered 2017-08-19: 5.04 via INTRAVENOUS

## 2017-09-26 ENCOUNTER — Encounter: Admission: RE | Payer: Self-pay | Source: Ambulatory Visit

## 2017-09-26 ENCOUNTER — Ambulatory Visit
Admission: RE | Admit: 2017-09-26 | Payer: BC Managed Care – PPO | Source: Ambulatory Visit | Admitting: Unknown Physician Specialty

## 2017-09-26 SURGERY — ESOPHAGOGASTRODUODENOSCOPY (EGD) WITH PROPOFOL
Anesthesia: General

## 2018-06-11 ENCOUNTER — Encounter: Payer: Self-pay | Admitting: *Deleted

## 2018-06-15 ENCOUNTER — Encounter: Payer: Self-pay | Admitting: *Deleted

## 2018-08-18 ENCOUNTER — Telehealth: Payer: Self-pay | Admitting: *Deleted

## 2018-08-18 DIAGNOSIS — Z87891 Personal history of nicotine dependence: Secondary | ICD-10-CM

## 2018-08-18 DIAGNOSIS — Z122 Encounter for screening for malignant neoplasm of respiratory organs: Secondary | ICD-10-CM

## 2018-08-18 NOTE — Telephone Encounter (Signed)
Patient has been notified that annual lung cancer screening low dose CT scan is due currently or will be in near future. Confirmed that patient is within the age range of 55-77, and asymptomatic, (no signs or symptoms of lung cancer). Patient denies illness that would prevent curative treatment for lung cancer if found. Verified smoking history, (current, 101 pack year). The shared decision making visit was done 07/12/17. Patient is agreeable for CT scan being scheduled.

## 2018-08-23 ENCOUNTER — Ambulatory Visit: Admission: RE | Admit: 2018-08-23 | Payer: BC Managed Care – PPO | Source: Ambulatory Visit

## 2018-09-08 ENCOUNTER — Telehealth: Payer: Self-pay

## 2018-09-08 NOTE — Telephone Encounter (Signed)
Call pt regarding lung screening. PT is a current smoker, smoking about 1 pack per day. PT would like a call back after Aug. 1. He has some liver problems at this time.

## 2018-11-16 ENCOUNTER — Telehealth: Payer: Self-pay | Admitting: *Deleted

## 2018-11-16 NOTE — Telephone Encounter (Signed)
Left message for patient to notify them that it is time to schedule annual low dose lung cancer screening CT scan. Instructed patient to call back to verify information prior to the scan being scheduled.  

## 2019-02-27 ENCOUNTER — Encounter: Payer: Self-pay | Admitting: *Deleted

## 2019-10-15 ENCOUNTER — Other Ambulatory Visit: Payer: Self-pay | Admitting: Oncology

## 2019-10-15 DIAGNOSIS — Z122 Encounter for screening for malignant neoplasm of respiratory organs: Secondary | ICD-10-CM

## 2019-10-15 DIAGNOSIS — Z87891 Personal history of nicotine dependence: Secondary | ICD-10-CM

## 2021-08-05 ENCOUNTER — Emergency Department
Admission: EM | Admit: 2021-08-05 | Discharge: 2021-08-06 | Disposition: A | Discharge status: Home / Self Care | Payer: Medicare PPO | Attending: Emergency Medicine | Admitting: Emergency Medicine

## 2021-08-05 ENCOUNTER — Emergency Department: Payer: Medicare PPO

## 2021-08-05 ENCOUNTER — Other Ambulatory Visit: Payer: Self-pay

## 2021-08-05 ENCOUNTER — Encounter: Payer: Self-pay | Admitting: Emergency Medicine

## 2021-08-05 DIAGNOSIS — F10920 Alcohol use, unspecified with intoxication, uncomplicated: Secondary | ICD-10-CM

## 2021-08-05 DIAGNOSIS — Z85828 Personal history of other malignant neoplasm of skin: Secondary | ICD-10-CM | POA: Diagnosis not present

## 2021-08-05 DIAGNOSIS — F10129 Alcohol abuse with intoxication, unspecified: Secondary | ICD-10-CM | POA: Diagnosis not present

## 2021-08-05 DIAGNOSIS — Z23 Encounter for immunization: Secondary | ICD-10-CM | POA: Diagnosis not present

## 2021-08-05 DIAGNOSIS — S4991XA Unspecified injury of right shoulder and upper arm, initial encounter: Secondary | ICD-10-CM | POA: Diagnosis present

## 2021-08-05 DIAGNOSIS — W19XXXA Unspecified fall, initial encounter: Secondary | ICD-10-CM

## 2021-08-05 DIAGNOSIS — S42031A Displaced fracture of lateral end of right clavicle, initial encounter for closed fracture: Secondary | ICD-10-CM | POA: Insufficient documentation

## 2021-08-05 DIAGNOSIS — Y92511 Restaurant or cafe as the place of occurrence of the external cause: Secondary | ICD-10-CM | POA: Diagnosis not present

## 2021-08-05 DIAGNOSIS — W01198A Fall on same level from slipping, tripping and stumbling with subsequent striking against other object, initial encounter: Secondary | ICD-10-CM | POA: Insufficient documentation

## 2021-08-05 DIAGNOSIS — Y908 Blood alcohol level of 240 mg/100 ml or more: Secondary | ICD-10-CM | POA: Insufficient documentation

## 2021-08-05 DIAGNOSIS — S42001A Fracture of unspecified part of right clavicle, initial encounter for closed fracture: Secondary | ICD-10-CM

## 2021-08-05 DIAGNOSIS — R519 Headache, unspecified: Secondary | ICD-10-CM | POA: Diagnosis not present

## 2021-08-05 MED ORDER — TETANUS-DIPHTH-ACELL PERTUSSIS 5-2.5-18.5 LF-MCG/0.5 IM SUSY
0.5000 mL | PREFILLED_SYRINGE | Freq: Once | INTRAMUSCULAR | Status: AC
  Administered 2021-08-06: 0.5 mL via INTRAMUSCULAR
  Filled 2021-08-05: qty 0.5
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
## 2021-08-06 LAB — CBC WITH DIFFERENTIAL/PLATELET
Abs Immature Granulocytes: 0.01 10*3/uL (ref 0.00–0.07)
Basophils Absolute: 0.1 10*3/uL (ref 0.0–0.1)
Basophils Relative: 1 %
Eosinophils Absolute: 0.1 10*3/uL (ref 0.0–0.5)
Eosinophils Relative: 2 %
HCT: 36.2 % — ABNORMAL LOW (ref 39.0–52.0)
Hemoglobin: 12.5 g/dL — ABNORMAL LOW (ref 13.0–17.0)
Immature Granulocytes: 0 %
Lymphocytes Relative: 32 %
Lymphs Abs: 1.6 10*3/uL (ref 0.7–4.0)
MCH: 34.6 pg — ABNORMAL HIGH (ref 26.0–34.0)
MCHC: 34.5 g/dL (ref 30.0–36.0)
MCV: 100.3 fL — ABNORMAL HIGH (ref 80.0–100.0)
Monocytes Absolute: 0.3 10*3/uL (ref 0.1–1.0)
Monocytes Relative: 6 %
Neutro Abs: 3 10*3/uL (ref 1.7–7.7)
Neutrophils Relative %: 59 %
Platelets: 136 10*3/uL — ABNORMAL LOW (ref 150–400)
RBC: 3.61 MIL/uL — ABNORMAL LOW (ref 4.22–5.81)
RDW: 11.9 % (ref 11.5–15.5)
WBC: 5.1 10*3/uL (ref 4.0–10.5)
nRBC: 0 % (ref 0.0–0.2)

## 2021-08-06 LAB — COMPREHENSIVE METABOLIC PANEL
ALT: 18 U/L (ref 0–44)
AST: 28 U/L (ref 15–41)
Albumin: 4 g/dL (ref 3.5–5.0)
Alkaline Phosphatase: 132 U/L — ABNORMAL HIGH (ref 38–126)
Anion gap: 8 (ref 5–15)
BUN: 11 mg/dL (ref 8–23)
CO2: 23 mmol/L (ref 22–32)
Calcium: 8.5 mg/dL — ABNORMAL LOW (ref 8.9–10.3)
Chloride: 102 mmol/L (ref 98–111)
Creatinine, Ser: 0.88 mg/dL (ref 0.61–1.24)
GFR, Estimated: >60 mL/min (ref >60)
Glucose, Bld: 106 mg/dL — ABNORMAL HIGH (ref 70–99)
Potassium: 4 mmol/L (ref 3.5–5.1)
Sodium: 133 mmol/L — ABNORMAL LOW (ref 135–145)
Total Bilirubin: 0.7 mg/dL (ref 0.3–1.2)
Total Protein: 8.2 g/dL — ABNORMAL HIGH (ref 6.5–8.1)

## 2021-08-06 LAB — ETHANOL: Alcohol, Ethyl (B): 311 mg/dL (ref <10)

## 2021-08-06 MED ORDER — ACETAMINOPHEN 500 MG PO TABS
1000.0000 mg | ORAL_TABLET | Freq: Once | ORAL | Status: AC
  Administered 2021-08-06: 1000 mg via ORAL
  Filled 2021-08-06: qty 2

## 2021-08-06 MED ORDER — TRAMADOL HCL 50 MG PO TABS: 50.0000 mg | ORAL_TABLET | Freq: Four times a day (QID) | ORAL | 0 refills | Status: AC | PRN

## 2021-08-06 MED ORDER — TRAMADOL HCL 50 MG PO TABS
50.0000 mg | ORAL_TABLET | Freq: Once | ORAL | Status: AC
  Administered 2021-08-06: 50 mg via ORAL
  Filled 2021-08-06: qty 1

## 2021-08-06 MED ORDER — ACETAMINOPHEN 500 MG PO TABS: 1000.0000 mg | ORAL_TABLET | Freq: Three times a day (TID) | ORAL | 0 refills | Status: AC | PRN

## 2021-08-06 NOTE — ED Notes (Signed)
  Abrasion to scalp cleaned.  Patient tolerated well.

## 2021-08-06 NOTE — Discharge Instructions (Signed)
As we discussed, you will need surgery to fix your clavicle.  Make sure to call Dr. Harlow Mares office for a follow-up.  In the meantime use the sling for comfort.  For pain take 1000 mg of Tylenol 3 times a day and 1 tramadol as needed.
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
